# Patient Record
Sex: Male | Born: 1970 | Race: Black or African American | Hispanic: No | Marital: Married | Smoking: Never smoker
Health system: Southern US, Community
[De-identification: ages and names within clinical notes are randomized; demographics above are authoritative.]

## PROBLEM LIST (undated history)

## (undated) DIAGNOSIS — G8929 Other chronic pain: Secondary | ICD-10-CM

## (undated) DIAGNOSIS — I10 Essential (primary) hypertension: Secondary | ICD-10-CM

## (undated) HISTORY — PX: SHOULDER SURGERY: SHX246

---

## 2003-02-11 ENCOUNTER — Encounter: Payer: Self-pay | Admitting: Emergency Medicine

## 2003-02-11 ENCOUNTER — Emergency Department (HOSPITAL_COMMUNITY): Admission: EM | Admit: 2003-02-11 | Discharge: 2003-02-12 | Payer: Self-pay | Admitting: Emergency Medicine

## 2004-03-19 ENCOUNTER — Emergency Department (HOSPITAL_COMMUNITY): Admission: EM | Admit: 2004-03-19 | Discharge: 2004-03-19 | Payer: Self-pay | Admitting: Emergency Medicine

## 2004-10-11 ENCOUNTER — Emergency Department (HOSPITAL_COMMUNITY): Admission: EM | Admit: 2004-10-11 | Discharge: 2004-10-11 | Payer: Self-pay | Admitting: Emergency Medicine

## 2008-04-07 ENCOUNTER — Emergency Department (HOSPITAL_COMMUNITY): Admission: EM | Admit: 2008-04-07 | Discharge: 2008-04-08 | Payer: Self-pay | Admitting: Emergency Medicine

## 2009-03-23 ENCOUNTER — Ambulatory Visit: Payer: Self-pay | Admitting: Internal Medicine

## 2009-03-23 DIAGNOSIS — K219 Gastro-esophageal reflux disease without esophagitis: Secondary | ICD-10-CM | POA: Insufficient documentation

## 2009-03-23 DIAGNOSIS — M25519 Pain in unspecified shoulder: Secondary | ICD-10-CM

## 2009-03-23 DIAGNOSIS — R030 Elevated blood-pressure reading, without diagnosis of hypertension: Secondary | ICD-10-CM

## 2009-04-05 ENCOUNTER — Encounter: Admission: RE | Admit: 2009-04-05 | Discharge: 2009-04-05 | Payer: Self-pay | Admitting: Orthopedic Surgery

## 2009-04-20 ENCOUNTER — Encounter: Admission: RE | Admit: 2009-04-20 | Discharge: 2009-04-20 | Payer: Self-pay | Admitting: Orthopedic Surgery

## 2009-05-17 ENCOUNTER — Ambulatory Visit (HOSPITAL_COMMUNITY): Admission: RE | Admit: 2009-05-17 | Discharge: 2009-05-17 | Payer: Self-pay | Admitting: Orthopedic Surgery

## 2009-05-30 ENCOUNTER — Ambulatory Visit: Payer: Self-pay | Admitting: Internal Medicine

## 2009-06-04 ENCOUNTER — Encounter: Admission: RE | Admit: 2009-06-04 | Discharge: 2009-07-25 | Payer: Self-pay | Admitting: Orthopedic Surgery

## 2009-09-25 ENCOUNTER — Encounter: Admission: RE | Admit: 2009-09-25 | Discharge: 2009-09-25 | Payer: Self-pay | Admitting: Orthopedic Surgery

## 2010-01-14 ENCOUNTER — Telehealth (INDEPENDENT_AMBULATORY_CARE_PROVIDER_SITE_OTHER): Payer: Self-pay | Admitting: *Deleted

## 2010-06-23 ENCOUNTER — Encounter: Payer: Self-pay | Admitting: Orthopedic Surgery

## 2010-07-02 NOTE — Progress Notes (Signed)
  Phone Note Other Incoming   Request: Send information Summary of Call: Request for records received from [facility]. Request forwarded to [facility].

## 2010-09-03 LAB — URINALYSIS, ROUTINE W REFLEX MICROSCOPIC
Bilirubin Urine: NEGATIVE
Glucose, UA: NEGATIVE mg/dL
Hgb urine dipstick: NEGATIVE
Ketones, ur: NEGATIVE mg/dL
Nitrite: NEGATIVE
Protein, ur: NEGATIVE mg/dL
Specific Gravity, Urine: 1.025 (ref 1.005–1.030)
Urobilinogen, UA: 1 mg/dL (ref 0.0–1.0)
pH: 7 (ref 5.0–8.0)

## 2010-09-03 LAB — COMPREHENSIVE METABOLIC PANEL
ALT: 31 U/L (ref 0–53)
AST: 30 U/L (ref 0–37)
Albumin: 4.2 g/dL (ref 3.5–5.2)
Alkaline Phosphatase: 66 U/L (ref 39–117)
BUN: 11 mg/dL (ref 6–23)
CO2: 24 mEq/L (ref 19–32)
Calcium: 9.8 mg/dL (ref 8.4–10.5)
Chloride: 105 mEq/L (ref 96–112)
Creatinine, Ser: 0.94 mg/dL (ref 0.4–1.5)
GFR calc Af Amer: >60 mL/min (ref >60)
GFR calc non Af Amer: >60 mL/min (ref >60)
Glucose, Bld: 83 mg/dL (ref 70–99)
Potassium: 4.2 mEq/L (ref 3.5–5.1)
Sodium: 137 mEq/L (ref 135–145)
Total Bilirubin: 1.1 mg/dL (ref 0.3–1.2)
Total Protein: 7.8 g/dL (ref 6.0–8.3)

## 2010-09-03 LAB — CBC
HCT: 48.4 % (ref 39.0–52.0)
Hemoglobin: 16.4 g/dL (ref 13.0–17.0)
MCHC: 34 g/dL (ref 30.0–36.0)
MCV: 98.2 fL (ref 78.0–100.0)
Platelets: 206 10*3/uL (ref 150–400)
RBC: 4.93 MIL/uL (ref 4.22–5.81)
RDW: 14.6 % (ref 11.5–15.5)
WBC: 10.6 10*3/uL — ABNORMAL HIGH (ref 4.0–10.5)

## 2011-03-04 LAB — DIFFERENTIAL
Basophils Absolute: 0.1
Basophils Relative: 1
Eosinophils Absolute: 0.6
Eosinophils Relative: 6 — ABNORMAL HIGH
Lymphocytes Relative: 27
Lymphs Abs: 2.9
Monocytes Absolute: 0.6
Monocytes Relative: 5
Neutro Abs: 6.7
Neutrophils Relative %: 61

## 2011-03-04 LAB — CBC
HCT: 52.9 — ABNORMAL HIGH
Hemoglobin: 17.2 — ABNORMAL HIGH
MCHC: 32.6
MCV: 96.6
Platelets: 235
RBC: 5.47
RDW: 13.7
WBC: 10.9 — ABNORMAL HIGH

## 2011-03-04 LAB — OSMOLALITY: Osmolality: 289

## 2011-03-04 LAB — SEDIMENTATION RATE: Sed Rate: 1

## 2012-05-24 ENCOUNTER — Encounter (HOSPITAL_COMMUNITY): Payer: Self-pay | Admitting: Emergency Medicine

## 2012-05-24 ENCOUNTER — Emergency Department (HOSPITAL_COMMUNITY)
Admission: EM | Admit: 2012-05-24 | Discharge: 2012-05-24 | Disposition: A | Discharge status: Home / Self Care | Payer: Non-veteran care | Attending: Emergency Medicine | Admitting: Emergency Medicine

## 2012-05-24 DIAGNOSIS — R509 Fever, unspecified: Secondary | ICD-10-CM | POA: Insufficient documentation

## 2012-05-24 DIAGNOSIS — I10 Essential (primary) hypertension: Secondary | ICD-10-CM | POA: Insufficient documentation

## 2012-05-24 DIAGNOSIS — Z79899 Other long term (current) drug therapy: Secondary | ICD-10-CM | POA: Insufficient documentation

## 2012-05-24 DIAGNOSIS — J111 Influenza due to unidentified influenza virus with other respiratory manifestations: Secondary | ICD-10-CM | POA: Insufficient documentation

## 2012-05-24 HISTORY — DX: Essential (primary) hypertension: I10

## 2012-05-24 LAB — CBC WITH DIFFERENTIAL/PLATELET
Basophils Absolute: 0.1 10*3/uL (ref 0.0–0.1)
Basophils Relative: 1 % (ref 0–1)
Eosinophils Absolute: 0 10*3/uL (ref 0.0–0.7)
Eosinophils Relative: 0 % (ref 0–5)
HCT: 41.8 % (ref 39.0–52.0)
Hemoglobin: 14.3 g/dL (ref 13.0–17.0)
Lymphocytes Relative: 8 % — ABNORMAL LOW (ref 12–46)
Lymphs Abs: 1 10*3/uL (ref 0.7–4.0)
MCH: 30.8 pg (ref 26.0–34.0)
MCHC: 34.2 g/dL (ref 30.0–36.0)
MCV: 90.1 fL (ref 78.0–100.0)
Monocytes Absolute: 1.1 10*3/uL — ABNORMAL HIGH (ref 0.1–1.0)
Monocytes Relative: 9 % (ref 3–12)
Neutro Abs: 9.8 10*3/uL — ABNORMAL HIGH (ref 1.7–7.7)
Neutrophils Relative %: 82 % — ABNORMAL HIGH (ref 43–77)
Platelets: 236 10*3/uL (ref 150–400)
RBC: 4.64 MIL/uL (ref 4.22–5.81)
RDW: 14.5 % (ref 11.5–15.5)
WBC: 12 10*3/uL — ABNORMAL HIGH (ref 4.0–10.5)

## 2012-05-24 LAB — COMPREHENSIVE METABOLIC PANEL
ALT: 12 U/L (ref 0–53)
AST: 18 U/L (ref 0–37)
Albumin: 4 g/dL (ref 3.5–5.2)
Alkaline Phosphatase: 72 U/L (ref 39–117)
BUN: 13 mg/dL (ref 6–23)
CO2: 21 mEq/L (ref 19–32)
Calcium: 9.6 mg/dL (ref 8.4–10.5)
Chloride: 104 mEq/L (ref 96–112)
Creatinine, Ser: 1.14 mg/dL (ref 0.50–1.35)
GFR calc Af Amer: 90 mL/min — ABNORMAL LOW (ref >90)
GFR calc non Af Amer: 77 mL/min — ABNORMAL LOW (ref >90)
Glucose, Bld: 94 mg/dL (ref 70–99)
Potassium: 3.5 mEq/L (ref 3.5–5.1)
Sodium: 137 mEq/L (ref 135–145)
Total Bilirubin: 1.1 mg/dL (ref 0.3–1.2)
Total Protein: 7.3 g/dL (ref 6.0–8.3)

## 2012-05-24 MED ORDER — ACETAMINOPHEN 325 MG PO TABS
650.0000 mg | ORAL_TABLET | Freq: Once | ORAL | Status: AC
  Administered 2012-05-24: 650 mg via ORAL
  Filled 2012-05-24: qty 2

## 2012-05-24 MED ORDER — SODIUM CHLORIDE 0.9 % IV BOLUS (SEPSIS)
1000.0000 mL | Freq: Once | INTRAVENOUS | Status: AC
  Administered 2012-05-24: 1000 mL via INTRAVENOUS

## 2012-05-24 MED ORDER — ONDANSETRON HCL 4 MG/2ML IJ SOLN
4.0000 mg | Freq: Once | INTRAMUSCULAR | Status: AC
  Administered 2012-05-24: 4 mg via INTRAVENOUS
  Filled 2012-05-24: qty 2

## 2012-05-24 MED ORDER — KETOROLAC TROMETHAMINE 30 MG/ML IJ SOLN
30.0000 mg | Freq: Once | INTRAMUSCULAR | Status: AC
  Administered 2012-05-24: 30 mg via INTRAVENOUS
  Filled 2012-05-24: qty 1

## 2012-05-24 MED ORDER — OSELTAMIVIR PHOSPHATE 75 MG PO CAPS: 75.0000 mg | ORAL_CAPSULE | Freq: Two times a day (BID) | ORAL | Status: AC

## 2012-05-24 NOTE — ED Provider Notes (Signed)
History     CSN: [id number]  Arrival date & time 05/24/12  1029   First MD Initiated Contact with Patient 05/24/12 1059      Chief Complaint  Patient presents with  . Nausea  . Emesis  . Fever    (Consider location/radiation/quality/duration/timing/severity/associated sxs/prior treatment) Patient is a 41 y.o. male presenting with vomiting and fever. The history is provided by the patient (the pt complains of muscle aches and vomiting for one day). No language interpreter was used.  Emesis  This is a new problem. The current episode started 12 to 24 hours ago. The problem has not changed since onset.There has been no fever. The fever has been present for less than 1 day. Associated symptoms include a fever. Pertinent negatives include no abdominal pain, no cough, no diarrhea and no headaches. Risk factors: none.  Fever Primary symptoms of the febrile illness include fever and vomiting. Primary symptoms do not include fatigue, headaches, cough, abdominal pain, diarrhea or rash.    Past Medical History  Diagnosis Date  . Hypertension     Past Surgical History  Procedure Date  . Shoulder surgery     History reviewed. No pertinent family history.  History  Substance Use Topics  . Smoking status: Never Smoker   . Smokeless tobacco: Not on file  . Alcohol Use: Yes      Review of Systems  Constitutional: Positive for fever. Negative for fatigue.  HENT: Negative for congestion, sinus pressure and ear discharge.   Eyes: Negative for discharge.  Respiratory: Negative for cough.   Cardiovascular: Negative for chest pain.  Gastrointestinal: Positive for vomiting. Negative for abdominal pain and diarrhea.  Genitourinary: Negative for frequency and hematuria.  Musculoskeletal: Negative for back pain.  Skin: Negative for rash.  Neurological: Negative for seizures and headaches.  Hematological: Negative.   Psychiatric/Behavioral: Negative for hallucinations.    Allergies   Review of patient's allergies indicates no known allergies.  Home Medications   Current Outpatient Rx  Name  Route  Sig  Dispense  Refill  . AMLODIPINE BESYLATE 10 MG PO TABS   Oral   Take 10 mg by mouth daily.         Marland Kitchen METOPROLOL TARTRATE 50 MG PO TABS   Oral   Take 25 mg by mouth 2 (two) times daily.         Dorothyann Peng MULTI-SYMPTOM COLD/FLU PO   Oral   Take 2 capsules by mouth every 6 (six) hours as needed. For cough and cold         . OSELTAMIVIR PHOSPHATE 75 MG PO CAPS   Oral   Take 1 capsule (75 mg total) by mouth every 12 (twelve) hours.   10 capsule   0     BP 109/45  Pulse 97  Temp 100 F (37.8 C) (Oral)  Resp 16  SpO2 95%  Physical Exam  Constitutional: He is oriented to person, place, and time. He appears well-developed.  HENT:  Head: Normocephalic and atraumatic.  Eyes: Conjunctivae normal and EOM are normal. No scleral icterus.  Neck: Neck supple. No thyromegaly present.  Cardiovascular: Normal rate and regular rhythm.  Exam reveals no gallop and no friction rub.   No murmur heard. Pulmonary/Chest: No stridor. He has no wheezes. He has no rales. He exhibits no tenderness.  Abdominal: He exhibits no distension. There is no tenderness. There is no rebound.  Musculoskeletal: Normal range of motion. He exhibits no edema.  Lymphadenopathy:  He has no cervical adenopathy.  Neurological: He is oriented to person, place, and time. Coordination normal.  Skin: No rash noted. No erythema.  Psychiatric: He has a normal mood and affect. His behavior is normal.    ED Course  Procedures (including critical care time)  Labs Reviewed  CBC WITH DIFFERENTIAL - Abnormal; Notable for the following:    WBC 12.0 (*)     Neutrophils Relative 82 (*)     Lymphocytes Relative 8 (*)     Neutro Abs 9.8 (*)     Monocytes Absolute 1.1 (*)     All other components within normal limits  COMPREHENSIVE METABOLIC PANEL - Abnormal; Notable for the following:    GFR  calc non Af Amer 77 (*)     GFR calc Af Amer 90 (*)     All other components within normal limits   No results found.   1. Influenza       MDM          Benny Lennert, MD 05/24/12 [phone]

## 2012-05-24 NOTE — ED Notes (Signed)
Per pt woke up this am with N/V, body aches, fever

## 2019-11-24 ENCOUNTER — Other Ambulatory Visit: Payer: Self-pay

## 2019-11-24 ENCOUNTER — Encounter (HOSPITAL_COMMUNITY): Payer: Self-pay

## 2019-11-24 ENCOUNTER — Ambulatory Visit (HOSPITAL_COMMUNITY)
Admission: EM | Admit: 2019-11-24 | Discharge: 2019-11-24 | Disposition: A | Discharge status: Home / Self Care | Payer: Self-pay | Attending: Emergency Medicine | Admitting: Emergency Medicine

## 2019-11-24 ENCOUNTER — Ambulatory Visit (INDEPENDENT_AMBULATORY_CARE_PROVIDER_SITE_OTHER): Payer: Self-pay

## 2019-11-24 DIAGNOSIS — M62830 Muscle spasm of back: Secondary | ICD-10-CM

## 2019-11-24 DIAGNOSIS — M779 Enthesopathy, unspecified: Secondary | ICD-10-CM

## 2019-11-24 DIAGNOSIS — G8929 Other chronic pain: Secondary | ICD-10-CM

## 2019-11-24 DIAGNOSIS — M5441 Lumbago with sciatica, right side: Secondary | ICD-10-CM

## 2019-11-24 DIAGNOSIS — M545 Low back pain: Secondary | ICD-10-CM

## 2019-11-24 DIAGNOSIS — M5442 Lumbago with sciatica, left side: Secondary | ICD-10-CM

## 2019-11-24 MED ORDER — TRAMADOL HCL 50 MG PO TABS: 50.0000 mg | ORAL_TABLET | Freq: Four times a day (QID) | ORAL | 0 refills | Status: AC | PRN

## 2019-11-24 MED ORDER — KETOROLAC TROMETHAMINE 30 MG/ML IJ SOLN
30.0000 mg | Freq: Once | INTRAMUSCULAR | Status: AC
  Administered 2019-11-24: 30 mg via INTRAMUSCULAR

## 2019-11-24 MED ORDER — METHYLPREDNISOLONE SODIUM SUCC 125 MG IJ SOLR
INTRAMUSCULAR | Status: AC
  Filled 2019-11-24: qty 2

## 2019-11-24 MED ORDER — KETOROLAC TROMETHAMINE 30 MG/ML IJ SOLN
INTRAMUSCULAR | Status: AC
  Filled 2019-11-24: qty 1

## 2019-11-24 MED ORDER — PREDNISONE 10 MG (21) PO TBPK: ORAL_TABLET | Freq: Every day | ORAL | 0 refills | Status: DC

## 2019-11-24 MED ORDER — METHYLPREDNISOLONE SODIUM SUCC 125 MG IJ SOLR
125.0000 mg | Freq: Once | INTRAMUSCULAR | Status: AC
  Administered 2019-11-24: 125 mg via INTRAMUSCULAR

## 2019-11-24 NOTE — ED Triage Notes (Signed)
Patient reports he was bending over to let his dog in his crate today and reports it felt like his bone were grinding together in his back. States it was extremely painful and make him fall; denies hitting his head. Unable to stand on own and had to ask for assistance from someone to stand.

## 2019-11-24 NOTE — ED Provider Notes (Signed)
[facility]    CSN: [id number] Arrival date & time: 11/24/19  1707      History   Chief Complaint Chief Complaint  Patient presents with  . Back Pain    HPI Jordan Vargas is a 49 y.o. male.   Lower back pain after putting dog in crate today. States that he does have chronic back pain and has not seen ortho for this. Denies any urinary issues lower pain with moment and feels like a cramp of spasm. Has not taken anything pta.      Past Medical History:  Diagnosis Date  . Hypertension     Patient Active Problem List   Diagnosis Date Noted  . GERD 03/23/2009  . SHOULDER PAIN, RIGHT 03/23/2009  . ELEVATED BLOOD PRESSURE WITHOUT DIAGNOSIS OF HYPERTENSION 03/23/2009    Past Surgical History:  Procedure Laterality Date  . SHOULDER SURGERY         Home Medications    Prior to Admission medications   Medication Sig Start Date End Date Taking? Authorizing Provider  amLODipine (NORVASC) 10 MG tablet Take 10 mg by mouth daily.    [provider]  metoprolol (LOPRESSOR) 50 MG tablet Take 25 mg by mouth 2 (two) times daily.    [provider]  oseltamivir (TAMIFLU) 75 MG capsule Take 1 capsule (75 mg total) by mouth every 12 (twelve) hours. 05/24/12   Jordan Berkshire, MD  predniSONE (STERAPRED UNI-PAK 21 TAB) 10 MG (21) TBPK tablet Take by mouth daily. Take 6 tabs by mouth daily  for 2 days, then 5 tabs for 2 days, then 4 tabs for 2 days, then 3 tabs for 2 days, 2 tabs for 2 days, then 1 tab by mouth daily for 2 days 11/24/19   Jordan Mark, NP  Pseudoephedrine-APAP-DM (DAYQUIL MULTI-SYMPTOM COLD/FLU PO) Take 2 capsules by mouth every 6 (six) hours as needed. For cough and cold    [provider]  traMADol (ULTRAM) 50 MG tablet Take 1 tablet (50 mg total) by mouth every 6 (six) hours as needed. 11/24/19   Jordan Mark, NP    Family History History reviewed. No pertinent family history.  Social History Social History    Tobacco Use  . Smoking status: Never Smoker  Substance Use Topics  . Alcohol use: Yes  . Drug use: No     Allergies   Patient has no known allergies.   Review of Systems Review of Systems  Constitutional: Negative.   Respiratory: Negative.   Cardiovascular: Negative.   Gastrointestinal: Negative.   Genitourinary: Negative.   Musculoskeletal: Positive for back pain.       Lower back pain and cramping   Skin: Negative.   Neurological: Positive for numbness.       Down bil legs      Physical Exam Triage Vital Signs ED Triage Vitals  Enc Vitals Group     BP 11/24/19 1723 (!) 151/80     Pulse Rate 11/24/19 1723 73     Resp 11/24/19 1723 16     Temp 11/24/19 1723 99 F (37.2 C)     Temp src --      SpO2 11/24/19 1723 98 %     Weight --      Height --      Head Circumference --      Peak Flow --      Pain Score 11/24/19 1721 7     Pain Loc --  Pain Edu? --      Excl. in [place]? --    No data found.  Updated Vital Signs BP (!) 151/80   Pulse 73   Temp 99 F (37.2 C)   Resp 16   SpO2 98%   Visual Acuity     Physical Exam Cardiovascular:     Rate and Rhythm: Normal rate.  Pulmonary:     Effort: Pulmonary effort is normal.  Abdominal:     General: Abdomen is flat.  Musculoskeletal:        General: Tenderness present.     Comments: Lower lumbar pain with movement, minimal ROM able to flex but not extend without pain,   Skin:    General: Skin is warm.     Capillary Refill: Capillary refill takes less than 2 seconds.  Neurological:     General: No focal deficit present.     Mental Status: He is alert.      UC Treatments / Results  Labs (all labs ordered are listed, but only abnormal results are displayed) Labs Reviewed - No data to display  EKG   Radiology DG Lumbar Spine Complete  Result Date: 11/24/2019 CLINICAL DATA:  Acute on chronic low back pain. EXAM: LUMBAR SPINE - COMPLETE 4+ VIEW COMPARISON:  Lumbar spine x-rays dated September 25, 2009. FINDINGS: Five lumbar type vertebral bodies. No acute fracture or subluxation. Vertebral body heights are preserved. Alignment is normal. New minimal anterior endplate spurring at G2-I9 and L4-L5. Intervertebral disc spaces are maintained. The sacroiliac joints are unremarkable. IMPRESSION: 1. No acute osseous abnormality or significant degenerative changes. Electronically Signed   By: Jordan Vargas M.D.   On: 11/24/2019 19:14    Procedures Procedures (including critical care time)  Medications Ordered in UC Medications  ketorolac (TORADOL) 30 MG/ML injection 30 mg (30 mg Intramuscular Given 11/24/19 1823)  methylPREDNISolone sodium succinate (SOLU-MEDROL) 125 mg/2 mL injection 125 mg (125 mg Intramuscular Given 11/24/19 1823)    Initial Impression / Assessment and Plan / UC Course  I have reviewed the triage vital signs and the nursing notes.  Pertinent labs & imaging results that were available during my care of the patient were reviewed by me and considered in my medical decision making (see chart for details).    Warm compresses Will need to follow up with ortho for further testing  NSAIDS for pain  discusssed several causes  If sx become worse go to the ER  Final Clinical Impressions(s) / UC Diagnoses   Final diagnoses:  Chronic midline low back pain with bilateral sciatica  Bone spur  Muscle spasm of back     Discharge Instructions     Warm compresses and water to held with pain  Nsaids  Pain meds as needed  Rest for the next 3 days  Will need to see [facility] since you have seen them before      ED Prescriptions    Medication Sig Dispense Auth. Provider   predniSONE (STERAPRED UNI-PAK 21 TAB) 10 MG (21) TBPK tablet Take by mouth daily. Take 6 tabs by mouth daily  for 2 days, then 5 tabs for 2 days, then 4 tabs for 2 days, then 3 tabs for 2 days, 2 tabs for 2 days, then 1 tab by mouth daily for 2 days 42 tablet Jordan Vargas L, NP   traMADol  (ULTRAM) 50 MG tablet Take 1 tablet (50 mg total) by mouth every 6 (six) hours as needed. 15 tablet Jordan Vargas  L, NP     PDMP not reviewed this encounter.   Jordan Mark, NP 11/24/19 1931

## 2019-11-24 NOTE — Discharge Instructions (Addendum)
Warm compresses and water to held with pain  Nsaids  Pain meds as needed  Rest for the next 3 days  Will need to see [facility] since you have seen them before

## 2019-12-01 ENCOUNTER — Emergency Department (HOSPITAL_COMMUNITY): Payer: No Typology Code available for payment source

## 2019-12-01 ENCOUNTER — Emergency Department (HOSPITAL_COMMUNITY)
Admission: EM | Admit: 2019-12-01 | Discharge: 2019-12-01 | Disposition: A | Discharge status: Home / Self Care | Payer: No Typology Code available for payment source | Attending: Emergency Medicine | Admitting: Emergency Medicine

## 2019-12-01 DIAGNOSIS — R200 Anesthesia of skin: Secondary | ICD-10-CM | POA: Diagnosis not present

## 2019-12-01 DIAGNOSIS — Z79899 Other long term (current) drug therapy: Secondary | ICD-10-CM | POA: Diagnosis not present

## 2019-12-01 DIAGNOSIS — I10 Essential (primary) hypertension: Secondary | ICD-10-CM | POA: Diagnosis not present

## 2019-12-01 DIAGNOSIS — M545 Low back pain, unspecified: Secondary | ICD-10-CM

## 2019-12-01 LAB — CBC WITH DIFFERENTIAL/PLATELET
Abs Immature Granulocytes: 0.1 10*3/uL — ABNORMAL HIGH (ref 0.00–0.07)
Basophils Absolute: 0 10*3/uL (ref 0.0–0.1)
Basophils Relative: 0 %
Eosinophils Absolute: 0.1 10*3/uL (ref 0.0–0.5)
Eosinophils Relative: 1 %
HCT: 41.9 % (ref 39.0–52.0)
Hemoglobin: 14 g/dL (ref 13.0–17.0)
Immature Granulocytes: 1 %
Lymphocytes Relative: 38 %
Lymphs Abs: 4.7 10*3/uL — ABNORMAL HIGH (ref 0.7–4.0)
MCH: 32.8 pg (ref 26.0–34.0)
MCHC: 33.4 g/dL (ref 30.0–36.0)
MCV: 98.1 fL (ref 80.0–100.0)
Monocytes Absolute: 1 10*3/uL (ref 0.1–1.0)
Monocytes Relative: 8 %
Neutro Abs: 6.3 10*3/uL (ref 1.7–7.7)
Neutrophils Relative %: 52 %
Platelets: 265 10*3/uL (ref 150–400)
RBC: 4.27 MIL/uL (ref 4.22–5.81)
RDW: 14.8 % (ref 11.5–15.5)
WBC: 12.3 10*3/uL — ABNORMAL HIGH (ref 4.0–10.5)
nRBC: 0 % (ref 0.0–0.2)

## 2019-12-01 LAB — URINALYSIS, ROUTINE W REFLEX MICROSCOPIC
Bilirubin Urine: NEGATIVE
Glucose, UA: NEGATIVE mg/dL
Hgb urine dipstick: NEGATIVE
Ketones, ur: NEGATIVE mg/dL
Leukocytes,Ua: NEGATIVE
Nitrite: NEGATIVE
Protein, ur: NEGATIVE mg/dL
Specific Gravity, Urine: 1.02 (ref 1.005–1.030)
pH: 6 (ref 5.0–8.0)

## 2019-12-01 LAB — BASIC METABOLIC PANEL
Anion gap: 8 (ref 5–15)
BUN: 17 mg/dL (ref 6–20)
CO2: 26 mmol/L (ref 22–32)
Calcium: 8.8 mg/dL — ABNORMAL LOW (ref 8.9–10.3)
Chloride: 104 mmol/L (ref 98–111)
Creatinine, Ser: 1.07 mg/dL (ref 0.61–1.24)
GFR calc Af Amer: >60 mL/min (ref >60)
GFR calc non Af Amer: >60 mL/min (ref >60)
Glucose, Bld: 99 mg/dL (ref 70–99)
Potassium: 3.5 mmol/L (ref 3.5–5.1)
Sodium: 138 mmol/L (ref 135–145)

## 2019-12-01 MED ORDER — OXYCODONE-ACETAMINOPHEN 5-325 MG PO TABS: 1.0000 | ORAL_TABLET | ORAL | 0 refills | Status: AC | PRN

## 2019-12-01 MED ORDER — AMLODIPINE BESYLATE 5 MG PO TABS: 5.0000 mg | ORAL_TABLET | Freq: Every day | ORAL | 1 refills | Status: AC

## 2019-12-01 MED ORDER — AMLODIPINE BESYLATE 5 MG PO TABS
5.0000 mg | ORAL_TABLET | Freq: Once | ORAL | Status: AC
  Administered 2019-12-01: 5 mg via ORAL
  Filled 2019-12-01: qty 1

## 2019-12-01 MED ORDER — IBUPROFEN 800 MG PO TABS: 800.0000 mg | ORAL_TABLET | Freq: Three times a day (TID) | ORAL | 0 refills | Status: AC | PRN

## 2019-12-01 MED ORDER — ONDANSETRON 4 MG PO TBDP: 4.0000 mg | ORAL_TABLET | Freq: Four times a day (QID) | ORAL | 0 refills | Status: AC | PRN

## 2019-12-01 MED ORDER — MORPHINE SULFATE (PF) 4 MG/ML IV SOLN
6.0000 mg | Freq: Once | INTRAVENOUS | Status: AC
  Administered 2019-12-01: 6 mg via INTRAVENOUS
  Filled 2019-12-01: qty 2

## 2019-12-01 MED ORDER — MORPHINE SULFATE (PF) 4 MG/ML IV SOLN
4.0000 mg | Freq: Once | INTRAVENOUS | Status: AC
  Administered 2019-12-01: 4 mg via INTRAVENOUS
  Filled 2019-12-01: qty 1

## 2019-12-01 MED ORDER — OXYCODONE-ACETAMINOPHEN 5-325 MG PO TABS
1.0000 | ORAL_TABLET | Freq: Once | ORAL | Status: DC
  Filled 2019-12-01: qty 1

## 2019-12-01 NOTE — ED Notes (Signed)
Patient to MRI.

## 2019-12-01 NOTE — Discharge Instructions (Addendum)
Your labs, urine and MRIs today were very reassuring.  You do not need admission to the hospital at this time or emergent neurosurgical consultation.  I recommend close follow-up with your primary care provider for management of your pain, possible physical therapy referral as well as management of your blood pressure.   You are being provided a prescription for opiates (also known as narcotics) for pain control.  Opiates can be addictive and should only be used when absolutely necessary for pain control when other alternatives do not work.  We recommend you only use them for the recommended amount of time and only as prescribed.  Please do not take with other sedative medications or alcohol.  Please do not drive, operate machinery, make important decisions while taking opiates.  Please note that these medications can be addictive and have high abuse potential.  Patients can become addicted to narcotics after only taking them for a few days.  Please keep these medications locked away from children, teenagers or any family members with history of substance abuse.  Narcotic pain medicine may also make you constipated.  You may use over-the-counter medications such as MiraLAX, Colace to prevent constipation.  If you become constipated you may use over-the-counter enemas as needed.  Itching and nausea are common side effects of narcotic pain medication.  If you develop uncontrolled vomiting or a rash, please stop these medications.   Steps to find a Primary Care Provider (PCP):  Call [phone] or [phone] to access "[facility] Find a Doctor Service."  2.  You may also go on the [facility] website at [email or web address]  3.  [facility] also frequently accepts new patients.  [facility]  [address] [place] [address] [phone]  4.  There are also multiple [facility], [facility] and [facility]  practices throughout the [place] that are frequently accepting new patients. You may find a clinic that is close to your home and contact them.  [facility] [email or web address] [phone]  [facility] [email or web address]  [facility] [email or web address] [phone]  [facility] [email or web address] [phone]  5.  Local Health Departments also can provide primary care services.  [facility]  [address] [place] [address] [phone]  [facility] Department [address] [place] [address] [phone]  [facility] [address] [place] [address]  [place] [address] [phone]

## 2019-12-01 NOTE — ED Notes (Signed)
Verbalized understanding of DC instructions, Rx, follow up care with PCP.  Able to ambulate before DC. Work note provided

## 2019-12-01 NOTE — ED Triage Notes (Signed)
Pt sent over by [facility] to get an MRi of his lower back from an injury to his back that is not getting better

## 2019-12-01 NOTE — ED Provider Notes (Signed)
7:15 AM  Assumed care.  Patient sent here from the [facility] hospital for further evaluation for increasing back pain.  Labs here show a mild leukocytosis which may be reactive.  He has not had any fever.  Urine shows no sign of infection.  Post void residual is 2 mL.  Confirmed with nursing staff.  MRI of the thoracic and lumbar spine were obtained with no significant abnormality, stenosis, abnormal cord signal.  Patient, family at bedside have been updated and have recommended close PCP follow-up as well as outpatient physical therapy.  I do not feel he needs to see neurosurgery given his very benign MRI results.  He does have high blood pressure here and states he has not seen a doctor in some time.  He denies any headache, vision changes, chest pain, abdominal pain.  We will start him on amlodipine 5 mg daily.  His blood pressure seems to go up when he is in more pain but at this time he appears comfortable and he is currently in the 170s/100s.  Will give outpatient PCP follow-up information.  Will discharge with pain medication, anti-inflammatories.  Patient and family are comfortable with this plan.   At this time, I do not feel there is any life-threatening condition present. I have reviewed, interpreted and discussed all results (EKG, imaging, lab, urine as appropriate) and exam findings with patient/family. I have reviewed nursing notes and appropriate previous records.  I feel the patient is safe to be discharged home without further emergent workup and can continue workup as an outpatient as needed. Discussed usual and customary return precautions. Patient/family verbalize understanding and are comfortable with this plan.  Outpatient follow-up has been provided as needed. All questions have been answered.     Jarika Robben, Layla Maw, DO 12/01/19 [phone]

## 2019-12-01 NOTE — ED Provider Notes (Signed)
[facility] EMERGENCY DEPARTMENT Provider Note   CSN: [id number] Arrival date & time: 12/01/19  1004     History No chief complaint on file.   Jordan Vargas is a 49 y.o. male.  Patient with history of high blood pressure currently not taking his medications, no primary doctor or medical insurance currently presents sent over from the [place]. patient has history of lower back pain however on Thursday he bent over and had sudden onset pain and felt like his bones were sliding/rubbing against each other lower back and since then has not been on the walk normal and has had significant pain and numbness down his legs.  Patient developed worsening symptoms on Sunday with tingling around his testicles and groin area.  Patient said difference sensation with bowel movements no incontinence.        Past Medical History:  Diagnosis Date  . Hypertension     Patient Active Problem List   Diagnosis Date Noted  . GERD 03/23/2009  . SHOULDER PAIN, RIGHT 03/23/2009  . ELEVATED BLOOD PRESSURE WITHOUT DIAGNOSIS OF HYPERTENSION 03/23/2009    Past Surgical History:  Procedure Laterality Date  . SHOULDER SURGERY         No family history on file.  Social History   Tobacco Use  . Smoking status: Never Smoker  Substance Use Topics  . Alcohol use: Yes  . Drug use: No    Home Medications Prior to Admission medications   Medication Sig Start Date End Date Taking? Authorizing Provider  amLODipine (NORVASC) 10 MG tablet Take 10 mg by mouth daily.    [provider]  metoprolol (LOPRESSOR) 50 MG tablet Take 25 mg by mouth 2 (two) times daily.    [provider]  oseltamivir (TAMIFLU) 75 MG capsule Take 1 capsule (75 mg total) by mouth every 12 (twelve) hours. 05/24/12   Bethann Berkshire, MD  predniSONE (STERAPRED UNI-PAK 21 TAB) 10 MG (21) TBPK tablet Take by mouth daily. Take 6 tabs by mouth daily  for 2 days, then 5 tabs for 2 days, then 4 tabs for 2  days, then 3 tabs for 2 days, 2 tabs for 2 days, then 1 tab by mouth daily for 2 days 11/24/19   Coralyn Mark, NP  Pseudoephedrine-APAP-DM (DAYQUIL MULTI-SYMPTOM COLD/FLU PO) Take 2 capsules by mouth every 6 (six) hours as needed. For cough and cold    [provider]  traMADol (ULTRAM) 50 MG tablet Take 1 tablet (50 mg total) by mouth every 6 (six) hours as needed. 11/24/19   Coralyn Mark, NP    Allergies    Patient has no known allergies.  Review of Systems   Review of Systems  Constitutional: Negative for chills and fever.  HENT: Negative for congestion.   Eyes: Negative for visual disturbance.  Respiratory: Negative for shortness of breath.   Cardiovascular: Negative for chest pain.  Gastrointestinal: Negative for abdominal pain and vomiting.  Genitourinary: Negative for dysuria and flank pain.  Musculoskeletal: Positive for back pain. Negative for neck pain and neck stiffness.  Skin: Negative for rash.  Neurological: Positive for numbness. Negative for syncope, weakness, light-headedness and headaches.    Physical Exam Updated Vital Signs BP (!) 196/107 (BP Location: Right Arm)   Pulse 63   Temp 98.4 F (36.9 C) (Oral)   Resp 18   SpO2 97%   Physical Exam Vitals and nursing note reviewed.  Constitutional:      Appearance: He is well-developed.  HENT:     Head: Normocephalic and atraumatic.  Eyes:     General:        Right eye: No discharge.        Left eye: No discharge.     Conjunctiva/sclera: Conjunctivae normal.  Neck:     Trachea: No tracheal deviation.  Cardiovascular:     Rate and Rhythm: Normal rate and regular rhythm.  Pulmonary:     Effort: Pulmonary effort is normal.     Breath sounds: Normal breath sounds.  Abdominal:     General: There is no distension.     Palpations: Abdomen is soft.     Tenderness: There is no abdominal tenderness. There is no guarding.  Musculoskeletal:     Cervical back: Normal range of motion and neck  supple.     Comments: Patient has tenderness to palpation midline paraspinal lumbar region  Skin:    General: Skin is warm.     Findings: No rash.     Comments: Patient has sensation perirectal, normal rectal tone  Neurological:     Mental Status: He is alert and oriented to person, place, and time.     Deep Tendon Reflexes:     Reflex Scores:      Patellar reflexes are 3+ on the right side and 3+ on the left side.      Achilles reflexes are 2+ on the right side and 2+ on the left side.    Comments: Patient has 5+ strength with flexion extension of hips, knees and ankles.  Sensation intact to major nerves to palpation however subjective numbness sensation bilateral.  No clonus.     ED Results / Procedures / Treatments   Labs (all labs ordered are listed, but only abnormal results are displayed) Labs Reviewed  CBC WITH DIFFERENTIAL/PLATELET - Abnormal; Notable for the following components:      Result Value   WBC 12.3 (*)    Lymphs Abs 4.7 (*)    Abs Immature Granulocytes 0.10 (*)    All other components within normal limits  BASIC METABOLIC PANEL - Abnormal; Notable for the following components:   Calcium 8.8 (*)    All other components within normal limits    EKG None  Radiology No results found.  Procedures Procedures (including critical care time)  Medications Ordered in ED Medications  oxyCODONE-acetaminophen (PERCOCET/ROXICET) 5-325 MG per tablet 1 tablet (has no administration in time range)  morphine 4 MG/ML injection 6 mg (has no administration in time range)    ED Course  I have reviewed the triage vital signs and the nursing notes.  Pertinent labs & imaging results that were available during my care of the patient were reviewed by me and considered in my medical decision making (see chart for details).    MDM Rules/Calculators/A&P                          Patient presents for assessment of worsening back pain with mild neurologic symptoms.  MRI ordered  to look for signs of cauda equina or other spinal cord impingement.  Patient blood pressure elevated likely combination from noncompliance with medication and pain.  Pain meds given.  Blood work ordered and overall unremarkable including normal hemoglobin, mild elevated white blood cell count 12.3.  Patient's care be signed out to follow-up MRI results and reassess. UA pending.  Final Clinical Impression(s) / ED Diagnoses Final diagnoses:  Acute bilateral low back pain with sciatica,  sciatica laterality unspecified    Rx / DC Orders ED Discharge Orders    None       Blane Ohara, MD 12/01/19 1505

## 2020-10-02 IMAGING — MR MR THORACIC SPINE W/O CM
5 of 12 series · 19 of 48 positions shown · non-contrast
Comparison: None.

CLINICAL DATA: Sudden onset low back pain, lower extremity pain and
numbness

EXAM:
MRI THORACIC AND LUMBAR SPINE WITHOUT CONTRAST
TECHNIQUE: Multiplanar and multiecho pulse sequences of the thoracic and lumbar
spine were obtained without intravenous contrast.

[Series 2: T1 · sagittal · 3.0mm · 0.90mm/px · 1 of 16 slices shown]
[im 1/16]
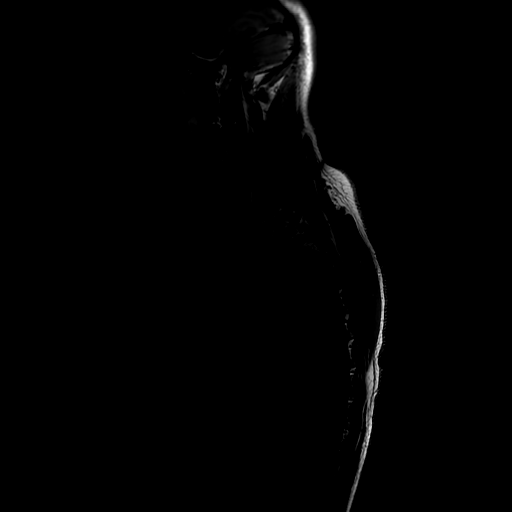

[Series 5: T2 · sagittal · 3.0mm · 0.66mm/px · 3 of 20 slices shown (1 of 4)]
[im 1/20]
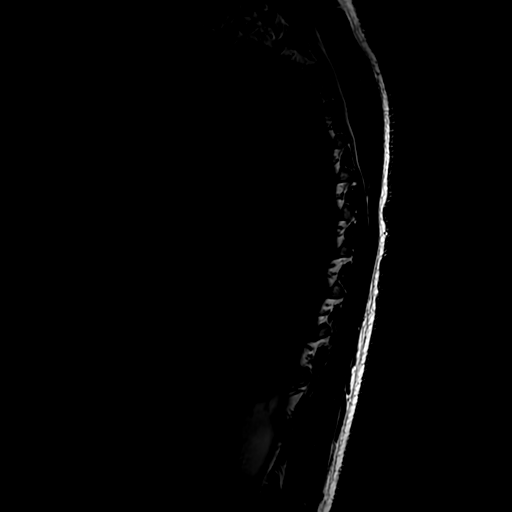
[im 10/20]
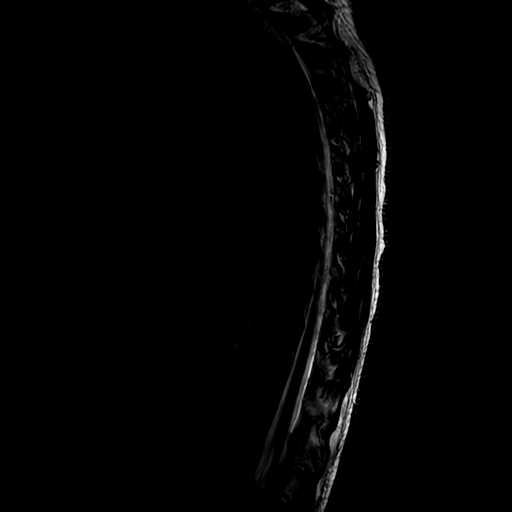
[im 20/20]
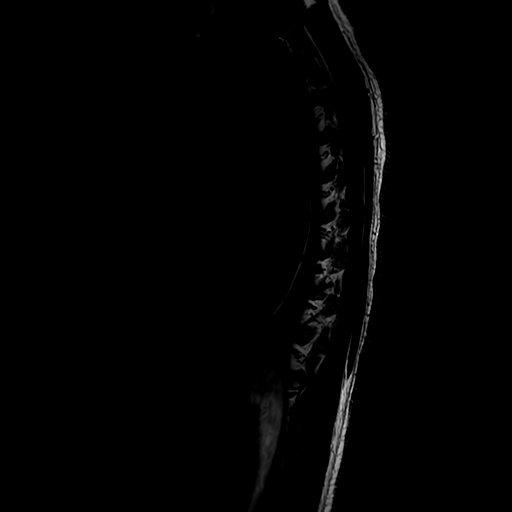

[Series 8: T2 · axial · 4.0mm · 0.39mm/px · z∈[-368,-148]mm · 6 of 36 slices shown (2 of 4)]
[im 1/36]
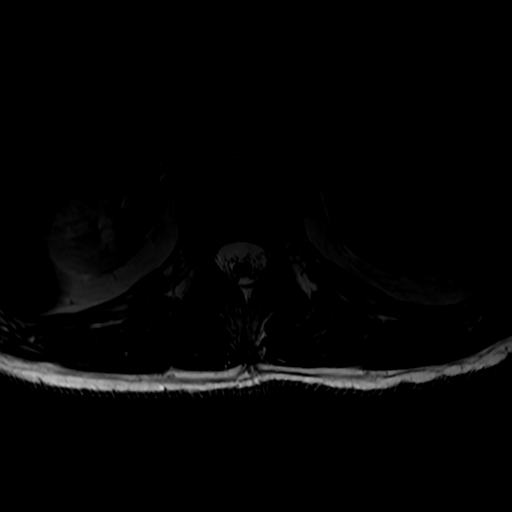
[im 8/36]
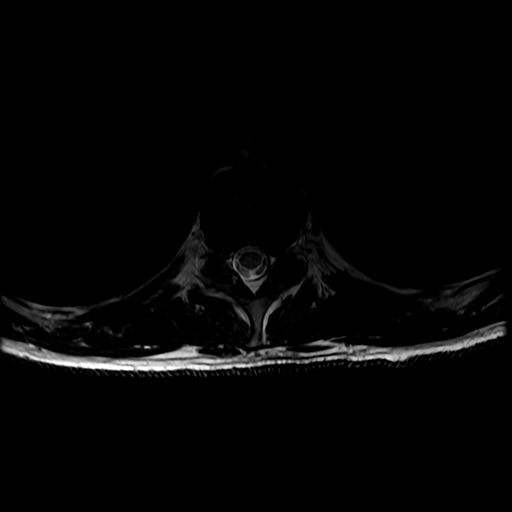
[im 15/36]
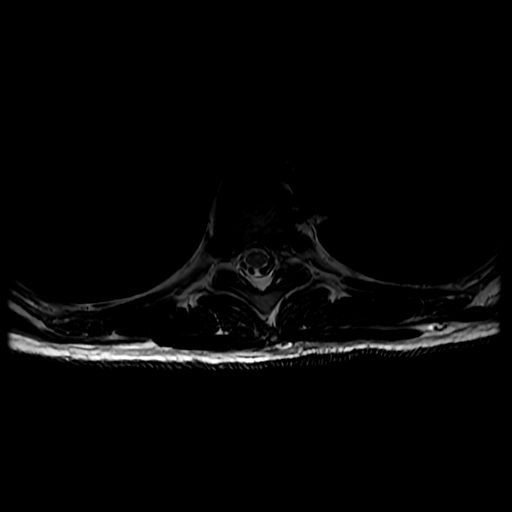
[im 22/36]
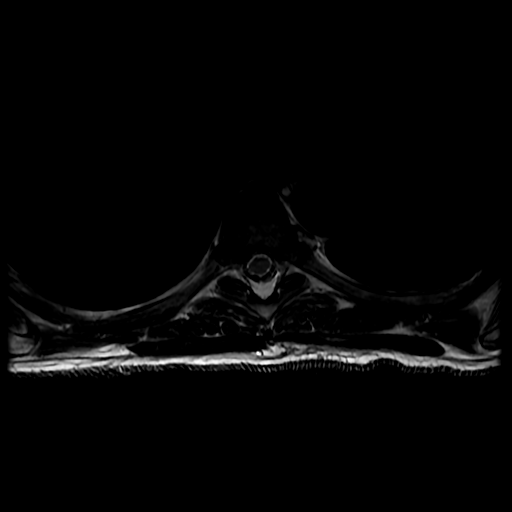
[im 29/36]
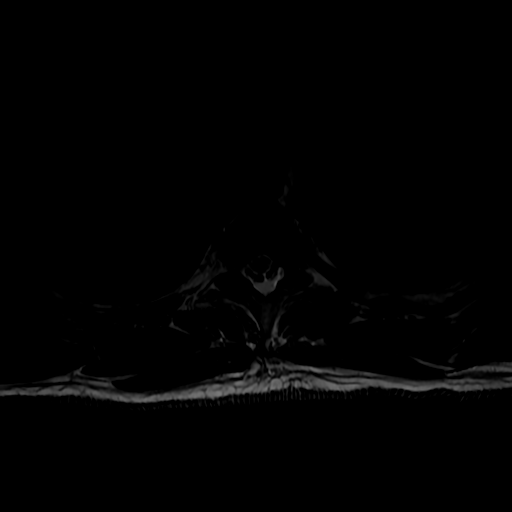
[im 36/36]
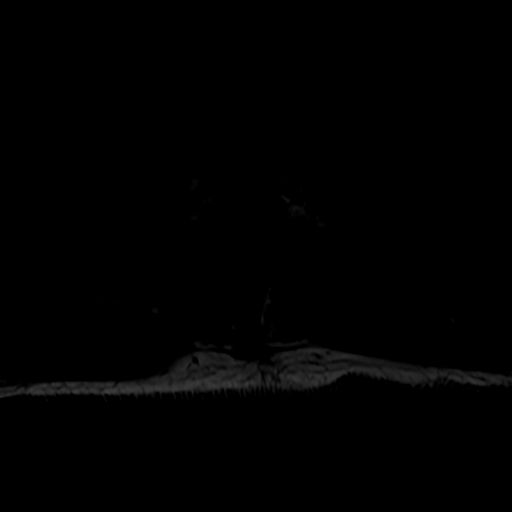

[Series 11: T2 · sagittal · 4.0mm · 0.55mm/px · 3 of 16 slices shown (3 of 4)]
[im 1/16]
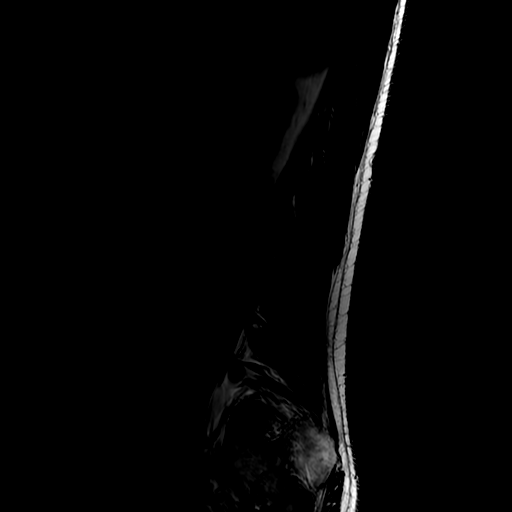
[im 8/16]
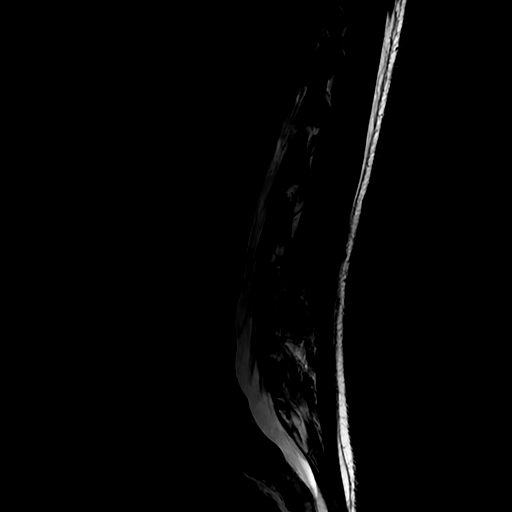
[im 16/16]
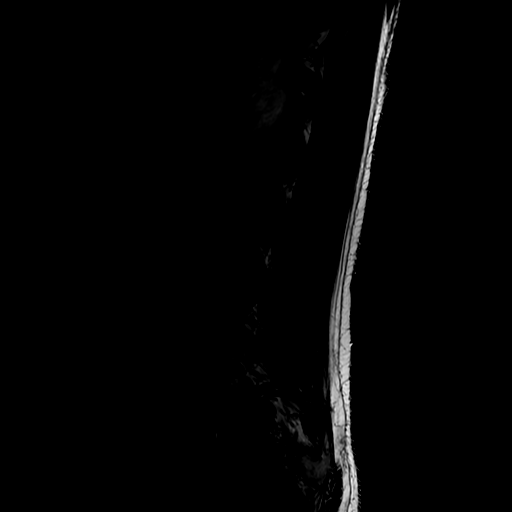

[Series 14: T2 · axial · 4.0mm · 0.39mm/px · z∈[-609,-392]mm · 6 of 38 slices shown (4 of 4)]
[im 1/38]
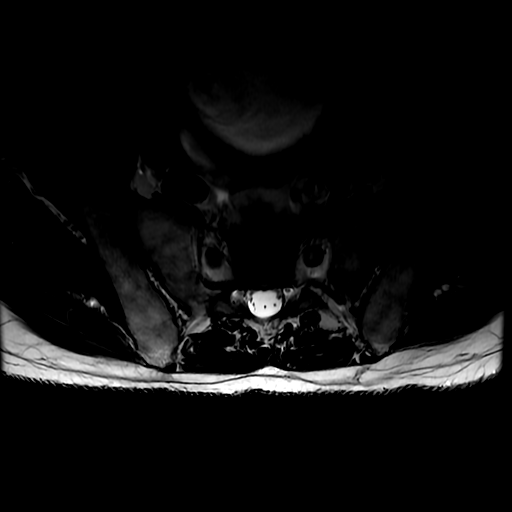
[im 8/38]
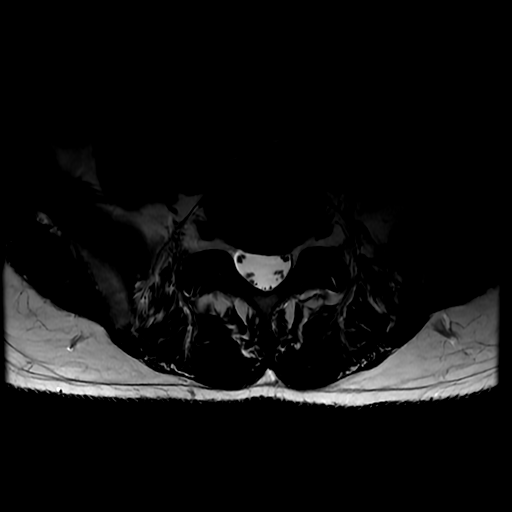
[im 15/38]
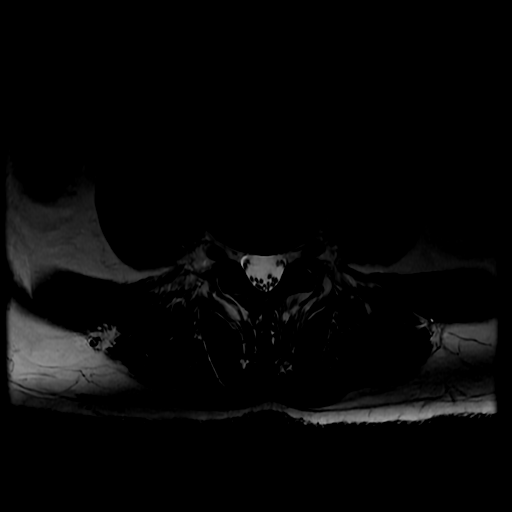
[im 23/38]
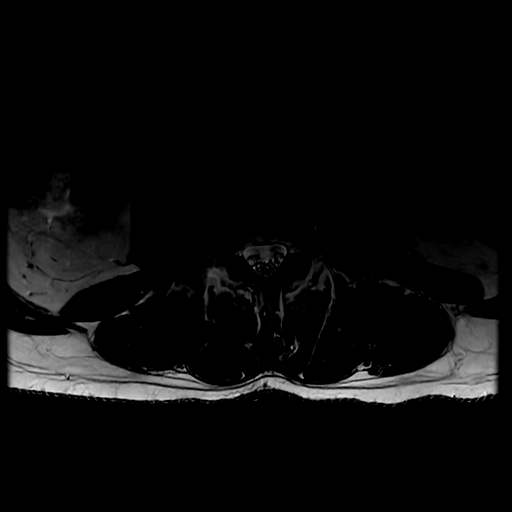
[im 30/38]
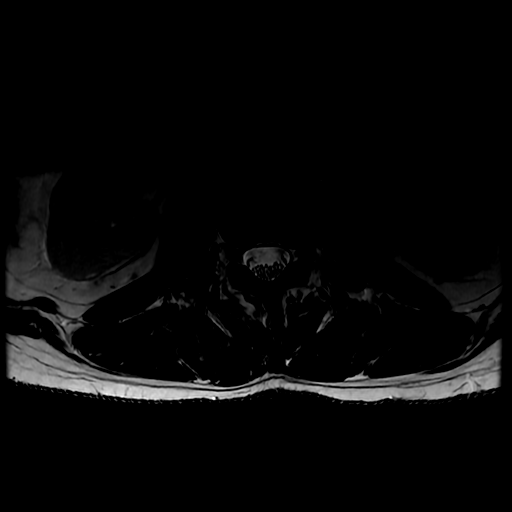
[im 38/38]
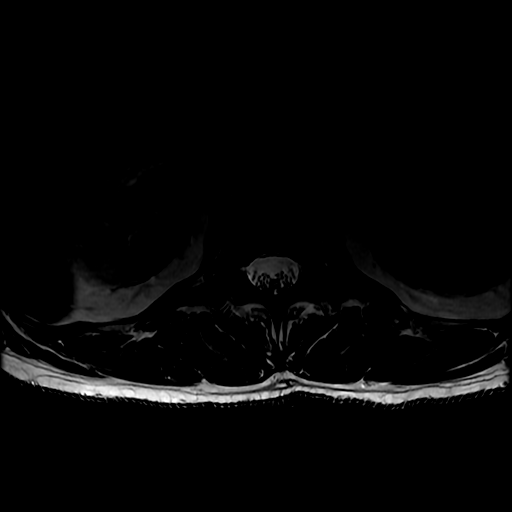

[19 of 48 positions shown; findings below may reference images not displayed]

FINDINGS: MRI THORACIC SPINE

Alignment: Preserved kyphosis. Anteroposterior alignment is
maintained.

Vertebrae: Vertebral body heights are maintained. There is no
significant marrow edema or suspicious osseous lesion.

Cord:  No abnormal signal.

Paraspinal and other soft tissues: Unremarkable.

Disc levels: Mild degenerative disc disease with small disc bulges
and protrusions. For example, there is a small right paracentral
protrusion at T5-T6. There is mild facet arthropathy. No significant
canal or foraminal stenosis at any level.

MRI LUMBAR SPINE

Segmentation:  Standard.

Alignment: Preserved lordosis. Anteroposterior alignment is
maintained.

Vertebrae: Vertebral body heights are maintained. There is no
significant marrow edema or suspicious osseous lesion.

Conus medullaris and cauda equina: Conus extends to the L1-L2 level.
Conus and cauda equina appear normal.

Paraspinal and other soft tissues: Unremarkable.

Disc levels:

L1-L2:  No canal or foraminal stenosis.

L2-L3:  Minimal disc bulge.  No canal or foraminal stenosis.

L3-L4:  Mild disc bulge.  No canal or foraminal stenosis.

L4-L5: Mild disc bulge with punctate central annular fissure. No
canal or foraminal stenosis.

L5-S1:  Mild disc bulge.  No canal or foraminal stenosis.
IMPRESSION: No abnormal cord signal. Mild degenerative changes without
high-grade stenosis.

## 2021-01-07 ENCOUNTER — Ambulatory Visit
Admission: EM | Admit: 2021-01-07 | Discharge: 2021-01-07 | Disposition: A | Discharge status: Home / Self Care | Payer: No Typology Code available for payment source

## 2021-01-07 ENCOUNTER — Other Ambulatory Visit: Payer: Self-pay

## 2021-01-07 DIAGNOSIS — M5441 Lumbago with sciatica, right side: Secondary | ICD-10-CM | POA: Diagnosis not present

## 2021-01-07 DIAGNOSIS — M5442 Lumbago with sciatica, left side: Secondary | ICD-10-CM | POA: Diagnosis not present

## 2021-01-07 DIAGNOSIS — R32 Unspecified urinary incontinence: Secondary | ICD-10-CM

## 2021-01-07 MED ORDER — PREDNISONE 10 MG (21) PO TBPK
ORAL_TABLET | Freq: Every day | ORAL | 0 refills | Status: AC
Start: 2021-01-07 — End: ?

## 2021-01-07 MED ORDER — KETOROLAC TROMETHAMINE 60 MG/2ML IM SOLN
30.0000 mg | Freq: Once | INTRAMUSCULAR | Status: AC
  Administered 2021-01-07: 30 mg via INTRAMUSCULAR

## 2021-01-07 NOTE — ED Provider Notes (Addendum)
[facility]    CSN: [id number] Arrival date & time: 01/07/21  1708      History   Chief Complaint Chief Complaint  Patient presents with   Back Pain   Numbness    BLE    HPI Jordan Vargas is a 50 y.o. male.   Patient presents with 1 day history of bilateral lower back pain.  Patient states that he bent over to put his socks on this morning, and he started having lower back pain.  Pain is radiating down bilateral legs with associated numbness and tingling.  Patient denies any urinary or bowel incontinence.  Denies any urinary burning or frequency.  Patient does have chronic back pain and states that this "feels the same".  Pain is worsened with any type of movement.  Patient was seen with in June and July 2021 and had MRI of back that was unremarkable.  Patient advised that time to see orthopedist but states that he has not seen an orthopedist in "years".   Back Pain  Past Medical History:  Diagnosis Date   Hypertension     Patient Active Problem List   Diagnosis Date Noted   GERD 03/23/2009   SHOULDER PAIN, RIGHT 03/23/2009   ELEVATED BLOOD PRESSURE WITHOUT DIAGNOSIS OF HYPERTENSION 03/23/2009    Past Surgical History:  Procedure Laterality Date   SHOULDER SURGERY         Home Medications    Prior to Admission medications   Medication Sig Start Date End Date Taking? Authorizing Provider  busPIRone (BUSPAR) 10 MG tablet TAKE ONE-HALF TABLET BY MOUTH TWO TIMES A DAY FOR ANXIETY. 10/31/20  Yes [provider]  diclofenac Sodium (VOLTAREN) 1 % GEL APPLY 2 GRAMS TOPICALLY THREE TIMES A DAY APPLY TO BACK AS NEEDED FOR PAIN RELIEF 02/14/20  Yes [provider]  FLUoxetine (PROZAC) 20 MG capsule TAKE ONE CAPSULE BY MOUTH EVERY DAY FOR DEPRESSION. 10/31/20  Yes [provider]  losartan (COZAAR) 50 MG tablet TAKE ONE TABLET BY MOUTH IN THE MORNING FOR BLOOD PRESSURE 02/15/20  Yes [provider]  predniSONE (STERAPRED UNI-PAK  21 TAB) 10 MG (21) TBPK tablet Take by mouth daily. Take 6 tabs by mouth daily  for 2 days, then 5 tabs for 2 days, then 4 tabs for 2 days, then 3 tabs for 2 days, 2 tabs for 2 days, then 1 tab by mouth daily for 2 days 01/07/21  Yes Lance Muss, FNP  traZODone (DESYREL) 50 MG tablet TAKE ONE-HALF TABLET BY MOUTH AT BEDTIME AS NEEDED FOR SLEEP 10/31/20  Yes [provider]  amLODipine (NORVASC) 5 MG tablet Take 1 tablet (5 mg total) by mouth daily. 12/01/19   Ward, Layla Maw, DO  ibuprofen (ADVIL) 800 MG tablet Take 1 tablet (800 mg total) by mouth every 8 (eight) hours as needed for mild pain. 12/01/19   Ward, Layla Maw, DO  naphazoline-glycerin (CLEAR EYES REDNESS) 0.012-0.2 % SOLN Place 1-2 drops into both eyes daily as needed for eye irritation.    [provider]  ondansetron (ZOFRAN ODT) 4 MG disintegrating tablet Take 1 tablet (4 mg total) by mouth every 6 (six) hours as needed. 12/01/19   Ward, Layla Maw, DO  oseltamivir (TAMIFLU) 75 MG capsule Take 1 capsule (75 mg total) by mouth every 12 (twelve) hours. Patient not taking: Reported on 12/01/2019 05/24/12   Bethann Berkshire, MD  oxyCODONE-acetaminophen (PERCOCET/ROXICET) 5-325 MG tablet Take 1 tablet by mouth every 4 (four) hours  as needed. 12/01/19   Ward, Layla Maw, DO  traMADol (ULTRAM) 50 MG tablet Take 1 tablet (50 mg total) by mouth every 6 (six) hours as needed. 11/24/19   Coralyn Mark, NP    Family History History reviewed. No pertinent family history.  Social History Social History   Tobacco Use   Smoking status: Never   Smokeless tobacco: Never  Substance Use Topics   Alcohol use: Yes   Drug use: No     Allergies   Patient has no known allergies.   Review of Systems Review of Systems Per HPI  Physical Exam Triage Vital Signs ED Triage Vitals  Enc Vitals Group     BP 01/07/21 1716 (!) 154/91     Pulse Rate 01/07/21 1716 85     Resp 01/07/21 1716 18     Temp 01/07/21 1716 98.9 F (37.2 C)      Temp Source 01/07/21 1716 Oral     SpO2 01/07/21 1716 97 %     Weight --      Height --      Head Circumference --      Peak Flow --      Pain Score 01/07/21 1721 6     Pain Loc --      Pain Edu? --      Excl. in GC? --    No data found.  Updated Vital Signs BP (!) 154/91 (BP Location: Left Arm)   Pulse 85   Temp 98.9 F (37.2 C) (Oral)   Resp 18   SpO2 97%   Visual Acuity Right Eye Distance:   Left Eye Distance:   Bilateral Distance:    Right Eye Near:   Left Eye Near:    Bilateral Near:     Physical Exam Constitutional:      Appearance: Normal appearance.  HENT:     Head: Normocephalic and atraumatic.  Eyes:     Extraocular Movements: Extraocular movements intact.     Conjunctiva/sclera: Conjunctivae normal.  Pulmonary:     Effort: Pulmonary effort is normal.  Musculoskeletal:     Cervical back: Normal.     Thoracic back: Normal.     Lumbar back: Tenderness present. No swelling or bony tenderness.     Comments: Tenderness to palpation throughout bilateral lower back.  Patient flinches with any type of palpation.  Patient is sitting in a guarded position due to pain.  Patient is unable to be in a seated position due to being uncomfortable.  Skin:    General: Skin is warm and dry.  Neurological:     General: No focal deficit present.     Mental Status: He is alert and oriented to person, place, and time. Mental status is at baseline.     Deep Tendon Reflexes: Reflexes are normal and symmetric.     Comments: Neurovascular intact.  Patient using a walker to walk due to pain.  Psychiatric:        Mood and Affect: Mood normal.        Behavior: Behavior normal.        Thought Content: Thought content normal.        Judgment: Judgment normal.     UC Treatments / Results  Labs (all labs ordered are listed, but only abnormal results are displayed) Labs Reviewed - No data to display  EKG   Radiology No results found.  Procedures Procedures (including  critical care time)  Medications Ordered in UC Medications  ketorolac (TORADOL) injection 30 mg (30 mg Intramuscular Given 01/07/21 1748)    Initial Impression / Assessment and Plan / UC Course  I have reviewed the triage vital signs and the nursing notes.  Pertinent labs & imaging results that were available during my care of the patient were reviewed by me and considered in my medical decision making (see chart for details).     Initial treatment plan was to discharge patient with ketorolac injection and prednisone steroid taper.  Although, was called to room by nursing staff while nursing staff was trying to discharge patient because patient had a question.  Patient originally denied that he was having any urinary or bowel incontinence when patient history was taken. Upon second entry to the room when called to room by nursing staff, patient was very unstable on his feet and stated that he was having some urinary incontinence that started a few days ago.  Advised patient that he needs to go to the emergency room as soon as possible for further evaluation and management.  Patient was advised to notify them of prednisone prescription that was already sent to pharmacy.  Patient stated that his mother will take him to the emergency department.  Patient voiced understanding and was agreeable with plan. Final Clinical Impressions(s) / UC Diagnoses   Final diagnoses:  Acute bilateral low back pain with bilateral sciatica     Discharge Instructions      You are being treated for acute bilateral back pain with sciatica.  You were given ketorolac injection in urgent care today to decrease pain and inflammation.  You were also prescribed prednisone steroid taper to decrease inflammation and help with back pain.  Please do not take any over-the-counter ibuprofen, Advil, Aleve while taking these medications.  Please start prednisone taper tomorrow due to receiving ketorolac injection in urgent care  today.  You are given contact information for [facility] neurosurgery and spine Center.  Please follow-up with the specialist for further evaluation and management due to chronic back pain.  Original discharge instructions are seen as above.  Due to patient's new description of symptoms and urinary incontinence while he was being discharged, patient was advised to go to the hospital for further evaluation and management.  See provider note.     ED Prescriptions     Medication Sig Dispense Auth. Provider   predniSONE (STERAPRED UNI-PAK 21 TAB) 10 MG (21) TBPK tablet Take by mouth daily. Take 6 tabs by mouth daily  for 2 days, then 5 tabs for 2 days, then 4 tabs for 2 days, then 3 tabs for 2 days, 2 tabs for 2 days, then 1 tab by mouth daily for 2 days 42 tablet Lance Muss, FNP      PDMP not reviewed this encounter.   Lance Muss, FNP 01/07/21 1759    Lance Muss, FNP 01/07/21 [phone]

## 2021-01-07 NOTE — Discharge Instructions (Addendum)
You are being treated for acute bilateral back pain with sciatica.  You were given ketorolac injection in urgent care today to decrease pain and inflammation.  You were also prescribed prednisone steroid taper to decrease inflammation and help with back pain.  Please do not take any over-the-counter ibuprofen, Advil, Aleve while taking these medications.  Please start prednisone taper tomorrow due to receiving ketorolac injection in urgent care today.  You are given contact information for [facility] neurosurgery and spine Center.  Please follow-up with the specialist for further evaluation and management due to chronic back pain.  Original discharge instructions are seen as above.  Due to patient's new description of symptoms and urinary incontinence while he was being discharged, patient was advised to go to the hospital for further evaluation and management.  See provider note.

## 2021-01-07 NOTE — ED Triage Notes (Signed)
Pt reports that his back "went out " this morning while trying to put his socks on. No meds taken. Pt fell backwards into the couch. No LOC. Pt is ambulating with a walker today due to back pain. Confirms n/t in the posterior aspect of his BLE. Standing and transitioning from sitting to standing aggravate sxs.

## 2023-06-03 ENCOUNTER — Encounter (HOSPITAL_BASED_OUTPATIENT_CLINIC_OR_DEPARTMENT_OTHER): Payer: Self-pay

## 2023-06-03 ENCOUNTER — Other Ambulatory Visit: Payer: Self-pay

## 2023-06-03 ENCOUNTER — Emergency Department (HOSPITAL_BASED_OUTPATIENT_CLINIC_OR_DEPARTMENT_OTHER)
Admission: EM | Admit: 2023-06-03 | Discharge: 2023-06-03 | Disposition: A | Discharge status: Home / Self Care | Payer: Non-veteran care | Attending: Emergency Medicine | Admitting: Emergency Medicine

## 2023-06-03 ENCOUNTER — Emergency Department (HOSPITAL_BASED_OUTPATIENT_CLINIC_OR_DEPARTMENT_OTHER): Payer: Non-veteran care

## 2023-06-03 DIAGNOSIS — Z79899 Other long term (current) drug therapy: Secondary | ICD-10-CM | POA: Insufficient documentation

## 2023-06-03 DIAGNOSIS — I10 Essential (primary) hypertension: Secondary | ICD-10-CM | POA: Diagnosis not present

## 2023-06-03 DIAGNOSIS — Y92410 Unspecified street and highway as the place of occurrence of the external cause: Secondary | ICD-10-CM | POA: Insufficient documentation

## 2023-06-03 DIAGNOSIS — M25512 Pain in left shoulder: Secondary | ICD-10-CM | POA: Diagnosis present

## 2023-06-03 MED ORDER — CYCLOBENZAPRINE HCL 10 MG PO TABS: 10.0000 mg | ORAL_TABLET | Freq: Two times a day (BID) | ORAL | 0 refills | Status: AC | PRN

## 2023-06-03 MED ORDER — KETOROLAC TROMETHAMINE 30 MG/ML IJ SOLN
30.0000 mg | Freq: Once | INTRAMUSCULAR | Status: AC
  Administered 2023-06-03: 30 mg via INTRAMUSCULAR
  Filled 2023-06-03: qty 1

## 2023-06-03 MED ORDER — KETOROLAC TROMETHAMINE 10 MG PO TABS: 10.0000 mg | ORAL_TABLET | Freq: Four times a day (QID) | ORAL | 0 refills | Status: AC | PRN
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
## 2023-06-03 NOTE — ED Notes (Signed)
 Pt alert and oriented X 4 at the time of discharge. RR even and unlabored. No acute distress noted. Pt verbalized understanding of discharge instructions as discussed. Pt ambulatory to lobby at time of discharge.
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
## 2023-07-04 ENCOUNTER — Emergency Department (HOSPITAL_BASED_OUTPATIENT_CLINIC_OR_DEPARTMENT_OTHER)
Admission: EM | Admit: 2023-07-04 | Discharge: 2023-07-04 | Disposition: A | Discharge status: Home / Self Care | Payer: No Typology Code available for payment source | Attending: Emergency Medicine | Admitting: Emergency Medicine

## 2023-07-04 ENCOUNTER — Encounter (HOSPITAL_BASED_OUTPATIENT_CLINIC_OR_DEPARTMENT_OTHER): Payer: Self-pay | Admitting: Emergency Medicine

## 2023-07-04 DIAGNOSIS — M25512 Pain in left shoulder: Secondary | ICD-10-CM | POA: Diagnosis present

## 2023-07-04 DIAGNOSIS — M67814 Other specified disorders of tendon, left shoulder: Secondary | ICD-10-CM

## 2023-07-04 DIAGNOSIS — M75102 Unspecified rotator cuff tear or rupture of left shoulder, not specified as traumatic: Secondary | ICD-10-CM | POA: Insufficient documentation

## 2023-07-04 DIAGNOSIS — M5412 Radiculopathy, cervical region: Secondary | ICD-10-CM | POA: Insufficient documentation

## 2023-07-04 MED ORDER — PREDNISONE 10 MG (21) PO TBPK: ORAL_TABLET | Freq: Every day | ORAL | 0 refills | Status: AC

## 2023-07-04 MED ORDER — NAPROXEN 375 MG PO TABS: 375.0000 mg | ORAL_TABLET | Freq: Two times a day (BID) | ORAL | 0 refills | Status: AC

## 2023-07-04 MED ORDER — LIDOCAINE 5 % EX PTCH
1.0000 | MEDICATED_PATCH | CUTANEOUS | Status: DC
  Administered 2023-07-04: 1 via TRANSDERMAL
  Filled 2023-07-04: qty 1

## 2023-07-04 MED ORDER — NAPROXEN 250 MG PO TABS
500.0000 mg | ORAL_TABLET | Freq: Once | ORAL | Status: AC
  Administered 2023-07-04: 500 mg via ORAL
  Filled 2023-07-04: qty 2
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
## 2023-07-04 NOTE — Discharge Instructions (Addendum)
Thank you for letting us evaluate you today.  I printed your MRI results from 2025.  I have sent steroids and "strong ibuprofen"/naprosyn to [facility].  Please do not take Aleve, ibuprofen, aspirin, Advil with naproxen as these are all types of ibuprofen.  We have given you 1 dose here in the emergency department so do not take until tomorrow morning.  You may start taking your steroid as soon as you pick it up  Please make sure to return to emergency department if you experience significant headache, altered mentation, visual disturbances
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
## 2024-01-27 ENCOUNTER — Encounter: Payer: Self-pay | Admitting: Emergency Medicine

## 2024-01-27 ENCOUNTER — Ambulatory Visit
Admission: EM | Admit: 2024-01-27 | Discharge: 2024-01-27 | Disposition: A | Discharge status: Home / Self Care | Attending: Physician Assistant | Admitting: Physician Assistant

## 2024-01-27 DIAGNOSIS — R519 Headache, unspecified: Secondary | ICD-10-CM

## 2024-01-27 HISTORY — DX: Other chronic pain: G89.29

## 2024-01-27 LAB — POC COVID19/FLU A&B COMBO
Covid Antigen, POC: NEGATIVE
Influenza A Antigen, POC: NEGATIVE
Influenza B Antigen, POC: NEGATIVE

## 2024-01-27 MED ORDER — KETOROLAC TROMETHAMINE 30 MG/ML IJ SOLN
30.0000 mg | Freq: Once | INTRAMUSCULAR | Status: AC
  Administered 2024-01-27: 30 mg via INTRAMUSCULAR

## 2024-01-27 NOTE — ED Triage Notes (Signed)
 Pt presents c/o headaches that started occurring in June. Pt says he went to the [facility] and they gave him 2 nasal sprays (Sodium Chloride  and Fluticasone) but they aren't helping the sxs. Pt also c/o ringing in his left ear, nasal congestion, and sore throat. Pt denies emesis and diarrhea.

## 2024-01-28 NOTE — ED Provider Notes (Addendum)
 [facility]    CSN: [id number] Arrival date & time: 01/27/24  1851      History   Chief Complaint Chief Complaint  Patient presents with   Headache    HPI Jordan Vargas is a 53 y.o. male.   Patient here today for evaluation of headaches that started in June.  He reports that he went to the [facility] and they gave him 2 nasal sprays but they do not seem to be helping.  He also reports of ringing in his left ear some nasal congestion and sore throat.  He has not had any vomiting or diarrhea.  He does not report fever.  The history is provided by the patient.  Headache Associated symptoms: congestion and sore throat   Associated symptoms: no abdominal pain, no cough, no diarrhea, no ear pain, no fever, no nausea and no vomiting     Past Medical History:  Diagnosis Date   Chronic back pain greater than 3 months duration    Chronic neck pain    Hypertension     Patient Active Problem List   Diagnosis Date Noted   GERD 03/23/2009   SHOULDER PAIN, RIGHT 03/23/2009   ELEVATED BLOOD PRESSURE WITHOUT DIAGNOSIS OF HYPERTENSION 03/23/2009    Past Surgical History:  Procedure Laterality Date   SHOULDER SURGERY         Home Medications    Prior to Admission medications   Medication Sig Start Date End Date Taking? Authorizing Provider  amLODipine  (NORVASC ) 5 MG tablet Take 1 tablet (5 mg total) by mouth daily. 12/01/19   Ward, Josette SAILOR, DO  busPIRone (BUSPAR) 10 MG tablet TAKE ONE-HALF TABLET BY MOUTH TWO TIMES A DAY FOR ANXIETY. 10/31/20   [provider]  cyclobenzaprine  (FLEXERIL ) 10 MG tablet Take 1 tablet (10 mg total) by mouth 2 (two) times daily as needed. 06/03/23   Silver Fell A, PA  diclofenac Sodium (VOLTAREN) 1 % GEL APPLY 2 GRAMS TOPICALLY THREE TIMES A DAY APPLY TO BACK AS NEEDED FOR PAIN RELIEF 02/14/20   [provider]  FLUoxetine (PROZAC) 20 MG capsule TAKE ONE CAPSULE BY MOUTH EVERY DAY FOR DEPRESSION. 10/31/20   [provider]  ibuprofen  (ADVIL ) 800 MG tablet Take 1 tablet (800 mg total) by mouth every 8 (eight) hours as needed for mild pain. 12/01/19   Ward, Josette SAILOR, DO  ketorolac  (TORADOL ) 10 MG tablet Take 1 tablet (10 mg total) by mouth every 6 (six) hours as needed. 06/03/23   Silver Fell LABOR, PA  losartan (COZAAR) 50 MG tablet TAKE ONE TABLET BY MOUTH IN THE MORNING FOR BLOOD PRESSURE 02/15/20   [provider]  naphazoline-glycerin (CLEAR EYES REDNESS) 0.012-0.2 % SOLN Place 1-2 drops into both eyes daily as needed for eye irritation.    [provider]  naproxen  (NAPROSYN ) 375 MG tablet Take 1 tablet (375 mg total) by mouth 2 (two) times daily. 07/04/23   Minnie Tinnie BRAVO, PA  ondansetron  (ZOFRAN  ODT) 4 MG disintegrating tablet Take 1 tablet (4 mg total) by mouth every 6 (six) hours as needed. 12/01/19   Ward, Josette SAILOR, DO  oseltamivir  (TAMIFLU ) 75 MG capsule Take 1 capsule (75 mg total) by mouth every 12 (twelve) hours. Patient not taking: Reported on 12/01/2019 05/24/12   Zammit, Joseph, MD  oxyCODONE -acetaminophen  (PERCOCET/ROXICET) 5-325 MG tablet Take 1 tablet by mouth every 4 (four) hours as needed. 12/01/19   Ward, Josette SAILOR, DO  predniSONE  (STERAPRED UNI-PAK 21 TAB) 10  MG (21) TBPK tablet Take by mouth daily. Take 6 tabs by mouth daily  for 2 days, then 5 tabs for 2 days, then 4 tabs for 2 days, then 3 tabs for 2 days, 2 tabs for 2 days, then 1 tab by mouth daily for 2 days 01/07/21   Hazen Darryle BRAVO, FNP  predniSONE  (STERAPRED UNI-PAK 21 TAB) 10 MG (21) TBPK tablet Take by mouth daily. Take 6 tabs by mouth daily  for 2 days, then 5 tabs for 2 days, then 4 tabs for 2 days, then 3 tabs for 2 days, 2 tabs for 2 days, then 1 tab by mouth daily for 2 days 07/04/23   Minnie Tinnie BRAVO, PA  traMADol  (ULTRAM ) 50 MG tablet Take 1 tablet (50 mg total) by mouth every 6 (six) hours as needed. 11/24/19   Merilee Andrea CROME, NP  traZODone (DESYREL) 50 MG tablet TAKE ONE-HALF TABLET BY MOUTH AT BEDTIME AS  NEEDED FOR SLEEP 10/31/20   [provider]    Family History History reviewed. No pertinent family history.  Social History Social History   Tobacco Use   Smoking status: Never    Passive exposure: Never   Smokeless tobacco: Never  Vaping Use   Vaping status: Never Used  Substance Use Topics   Alcohol use: Yes   Drug use: No     Allergies   Patient has no known allergies.   Review of Systems Review of Systems  Constitutional:  Negative for chills and fever.  HENT:  Positive for congestion, sore throat and tinnitus. Negative for ear pain.   Eyes:  Negative for discharge and redness.  Respiratory:  Negative for cough and shortness of breath.   Gastrointestinal:  Negative for abdominal pain, diarrhea, nausea and vomiting.  Neurological:  Positive for headaches.     Physical Exam Triage Vital Signs ED Triage Vitals  Encounter Vitals Group     BP 01/27/24 1927 (!) 163/83     Girls Systolic BP Percentile --      Girls Diastolic BP Percentile --      Boys Systolic BP Percentile --      Boys Diastolic BP Percentile --      Pulse Rate 01/27/24 1927 69     Resp 01/27/24 1927 18     Temp 01/27/24 1927 98.1 F (36.7 C)     Temp Source 01/27/24 1927 Oral     SpO2 01/27/24 1927 98 %     Weight 01/27/24 1924 169 lb 15.6 oz (77.1 kg)     Height --      Head Circumference --      Peak Flow --      Pain Score 01/27/24 1924 6     Pain Loc --      Pain Education --      Exclude from Growth Chart --    No data found.  Updated Vital Signs BP (!) 163/83 (BP Location: Left Arm)   Pulse 69   Temp 98.1 F (36.7 C) (Oral)   Resp 18   Wt 169 lb 15.6 oz (77.1 kg)   SpO2 98%   BMI 23.05 kg/m   Visual Acuity Right Eye Distance:   Left Eye Distance:   Bilateral Distance:    Right Eye Near:   Left Eye Near:    Bilateral Near:     Physical Exam Vitals and nursing note reviewed.  Constitutional:      General: He is not in acute distress.  Appearance:  Normal appearance. He is not ill-appearing.  HENT:     Head: Normocephalic and atraumatic.     Right Ear: Tympanic membrane normal.     Left Ear: Tympanic membrane normal.     Nose: Nose normal. No congestion.     Mouth/Throat:     Mouth: Mucous membranes are moist.     Pharynx: Oropharynx is clear. No oropharyngeal exudate or posterior oropharyngeal erythema.  Eyes:     Extraocular Movements: Extraocular movements intact.     Conjunctiva/sclera: Conjunctivae normal.     Pupils: Pupils are equal, round, and reactive to light.  Cardiovascular:     Rate and Rhythm: Normal rate and regular rhythm.     Heart sounds: Normal heart sounds. No murmur heard. Pulmonary:     Effort: Pulmonary effort is normal. No respiratory distress.     Breath sounds: Normal breath sounds. No wheezing, rhonchi or rales.  Skin:    General: Skin is warm and dry.  Neurological:     Mental Status: He is alert.     Comments: No facial droop, normal speech, normal finger to nose, normal heel to shin  Psychiatric:        Mood and Affect: Mood normal.        Thought Content: Thought content normal.      UC Treatments / Results  Labs (all labs ordered are listed, but only abnormal results are displayed) Labs Reviewed  POC COVID19/FLU A&B COMBO - Normal    EKG   Radiology No results found.  Procedures Procedures (including critical care time)  Medications Ordered in UC Medications  ketorolac  (TORADOL ) 30 MG/ML injection 30 mg (30 mg Intramuscular Given 01/27/24 2011)    Initial Impression / Assessment and Plan / UC Course  I have reviewed the triage vital signs and the nursing notes.  Pertinent labs & imaging results that were available during my care of the patient were reviewed by me and considered in my medical decision making (see chart for details).    COVID and flu screening negative.  Toradol  injection administered in office to hopefully help break headache cycle.  Encouraged follow-up if  no gradual improvement with any further concerns.  Advised emergency department visit with any worsening symptoms.  Final Clinical Impressions(s) / UC Diagnoses   Final diagnoses:  Acute nonintractable headache, unspecified headache type   Discharge Instructions   None    ED Prescriptions   None    PDMP not reviewed this encounter.   Billy Asberry FALCON, PA-C 01/28/24 1537    Billy Asberry FALCON, PA-C 01/28/24 1538    Billy Asberry FALCON, PA-C 01/28/24 1539
